# Patient Record
Sex: Female | Born: 1990
Health system: Southern US, Community
[De-identification: ages and names within clinical notes are randomized; demographics above are authoritative.]

## PROBLEM LIST (undated history)

## (undated) DIAGNOSIS — N200 Calculus of kidney: Secondary | ICD-10-CM

## (undated) DIAGNOSIS — C801 Malignant (primary) neoplasm, unspecified: Secondary | ICD-10-CM

## (undated) HISTORY — PX: TUMOR REMOVAL: SHX12

---

## 2019-05-26 DIAGNOSIS — Z20828 Contact with and (suspected) exposure to other viral communicable diseases: Secondary | ICD-10-CM | POA: Diagnosis not present

## 2019-05-26 DIAGNOSIS — Z03818 Encounter for observation for suspected exposure to other biological agents ruled out: Secondary | ICD-10-CM | POA: Diagnosis not present

## 2019-12-20 DIAGNOSIS — H6123 Impacted cerumen, bilateral: Secondary | ICD-10-CM | POA: Diagnosis not present

## 2020-09-11 ENCOUNTER — Other Ambulatory Visit: Payer: Self-pay

## 2020-09-11 ENCOUNTER — Ambulatory Visit (INDEPENDENT_AMBULATORY_CARE_PROVIDER_SITE_OTHER): Payer: BC Managed Care – PPO | Admitting: Internal Medicine

## 2020-09-11 VITALS — BP 122/71 | HR 86 | Temp 98.1°F | Ht 61.0 in | Wt 126.8 lb

## 2020-09-11 DIAGNOSIS — Z114 Encounter for screening for human immunodeficiency virus [HIV]: Secondary | ICD-10-CM | POA: Diagnosis not present

## 2020-09-11 DIAGNOSIS — Z1159 Encounter for screening for other viral diseases: Secondary | ICD-10-CM | POA: Diagnosis not present

## 2020-09-11 DIAGNOSIS — Z0000 Encounter for general adult medical examination without abnormal findings: Secondary | ICD-10-CM

## 2020-09-11 DIAGNOSIS — Z8639 Personal history of other endocrine, nutritional and metabolic disease: Secondary | ICD-10-CM

## 2020-09-11 NOTE — Progress Notes (Signed)
CC: History of Hashimoto's  HPI:  [name] is a 30 y.o. with a history of Hashimoto's disease presenting to establish care and follow-up regarding her thyroid disease. For details of today's visit and the status of his chronic medical issues please refer to the assessment and plan.   No past medical history on file.   Surgical history: Brain surgery as a child due to a neuroblastoma  Family history: Heart disease   No known allergies  Social history: Lives here in [name] alone.  Works as an Airline pilot for [employer].  Drinks alcohol socially and occasionally every few weeks.  Denies any tobacco or illicit drug use.  Not currently sexually active.  Review of Systems:   Review of Systems  Constitutional: Negative for chills, diaphoresis, fever, malaise/fatigue and weight loss.  Eyes: Negative for blurred vision.  Respiratory: Negative for cough and shortness of breath.   Cardiovascular: Negative for chest pain, palpitations and leg swelling.  Gastrointestinal: Negative for abdominal pain, constipation, diarrhea, nausea and vomiting.  Genitourinary: Negative for dysuria, frequency and urgency.  Musculoskeletal: Negative for myalgias.  Skin: Negative for itching and rash.  Neurological: Negative for dizziness, weakness and headaches.  Psychiatric/Behavioral: The patient is not nervous/anxious and does not have insomnia.      Physical Exam:  Vitals:   09/11/20 1347  BP: 122/71  Pulse: 86  Temp: 98.1 F (36.7 C)  TempSrc: Oral  SpO2: 99%  Weight: 126 lb 12.8 oz (57.5 kg)  Height: 5\' 1"  (1.549 m)   Physical Exam Vitals reviewed.  Constitutional:      General: She is not in acute distress.    Appearance: Normal appearance. She is not ill-appearing.  HENT:     Head: Normocephalic and atraumatic.  Eyes:     Extraocular Movements: Extraocular movements intact.     Pupils: Pupils are equal, round, and reactive to light.     Comments: Left pupil  more constricted than right, chronic due to brain surgery as a child  Neck:     Comments: Enlarged neck mass, greater on the left than the right Cardiovascular:     Rate and Rhythm: Normal rate and regular rhythm.     Pulses: Normal pulses.     Heart sounds: Normal heart sounds. No murmur heard. No friction rub. No gallop.   Pulmonary:     Effort: Pulmonary effort is normal. No respiratory distress.     Breath sounds: Normal breath sounds. No wheezing or rales.  Abdominal:     General: Abdomen is flat. Bowel sounds are normal. There is no distension.     Palpations: Abdomen is soft.     Tenderness: There is no abdominal tenderness. There is no guarding.  Musculoskeletal:        General: Swelling present. No tenderness.     Cervical back: Neck supple. No tenderness.     Right lower leg: No edema.     Left lower leg: No edema.  Skin:    General: Skin is warm and dry.  Neurological:     Mental Status: She is alert and oriented to person, place, and time.  Psychiatric:        Mood and Affect: Mood normal.        Behavior: Behavior normal.        Thought Content: Thought content normal.        Judgment: Judgment normal.     Assessment & Plan:   See Encounters Tab for problem based  charting.  Patient discussed with Dr. Dareen Piano

## 2020-09-11 NOTE — Assessment & Plan Note (Signed)
Follow-up HIV antibody and hep C antibody

## 2020-09-11 NOTE — Patient Instructions (Signed)
Thank you for allowing [place] to provide your care today. Today we discussed your history of hashimoto disease.  I have ordered thyroid labs for you. I will call if any are abnormal.    Should you have any questions or concerns please call the internal medicine clinic at [phone].

## 2020-09-11 NOTE — Assessment & Plan Note (Signed)
Patient reports a history of Hashimoto's thyroiditis diagnosed around 2017.  She states that she noticed a lump in her neck on the left side and had this worked up.  The time she reversed her thyroid function studies were normal but ultrasound showed multiple thyroid nodules and confirmed Hashimoto's.  She was referred to an endocrinologist but was never seen.  Denies taking any medications for her thyroid disease.  Denies any heat or cold intolerance, weight gain or weight loss, hair loss, palpitations, fatigue or anxiety.  Unfortunately unable to access her previous labs and imaging from [facility].  We will repeat thyroid function studies and thyroid antibody levels today.  If needed will obtain ultrasound imaging of the thyroid as well.  Plan: Follow-up TSH, free T3 and T4, thyroid peroxidase antibody and thyroglobulin antibody

## 2020-09-12 LAB — THYROID PEROXIDASE ANTIBODY: Thyroperoxidase Ab SerPl-aCnc: <8 IU/mL (ref 0–34)

## 2020-09-12 LAB — T4, FREE: Free T4: 0.91 ng/dL (ref 0.82–1.77)

## 2020-09-12 LAB — THYROGLOBULIN ANTIBODY: Thyroglobulin Antibody: <1 IU/mL (ref 0.0–0.9)

## 2020-09-12 LAB — HAV ANTIBODY W/ RFX: hep A Total Ab: NEGATIVE

## 2020-09-12 LAB — TSH: TSH: 0.534 u[IU]/mL (ref 0.450–4.500)

## 2020-09-12 LAB — HEPATITIS C ANTIBODY: Hep C Virus Ab: <0.1 s/co ratio (ref 0.0–0.9)

## 2020-09-12 LAB — T3, FREE: T3, Free: 4 pg/mL (ref 2.0–4.4)

## 2020-09-13 NOTE — Progress Notes (Signed)
Internal Medicine Clinic Attending ° °Case discussed with Dr. Rehman  At the time of the visit.  We reviewed the resident’s history and exam and pertinent patient test results.  I agree with the assessment, diagnosis, and plan of care documented in the resident’s note.  ° °

## 2020-09-14 ENCOUNTER — Other Ambulatory Visit: Payer: Self-pay | Admitting: Internal Medicine

## 2020-09-14 DIAGNOSIS — Z8639 Personal history of other endocrine, nutritional and metabolic disease: Secondary | ICD-10-CM

## 2020-10-06 ENCOUNTER — Ambulatory Visit (HOSPITAL_COMMUNITY): Payer: BC Managed Care – PPO

## 2020-10-10 ENCOUNTER — Ambulatory Visit (HOSPITAL_COMMUNITY)
Admission: RE | Admit: 2020-10-10 | Discharge: 2020-10-10 | Disposition: A | Discharge status: Home / Self Care | Payer: BC Managed Care – PPO | Source: Ambulatory Visit | Attending: Internal Medicine | Admitting: Internal Medicine

## 2020-10-10 ENCOUNTER — Other Ambulatory Visit: Payer: Self-pay

## 2020-10-10 DIAGNOSIS — Z8639 Personal history of other endocrine, nutritional and metabolic disease: Secondary | ICD-10-CM | POA: Diagnosis not present

## 2020-10-10 DIAGNOSIS — E041 Nontoxic single thyroid nodule: Secondary | ICD-10-CM | POA: Diagnosis not present

## 2020-11-24 NOTE — Addendum Note (Signed)
Encounter addended by: Novella Olive on: 11/24/2020 6:05 PM  Actions taken: Letter saved

## 2021-05-27 DIAGNOSIS — R112 Nausea with vomiting, unspecified: Secondary | ICD-10-CM | POA: Diagnosis not present

## 2021-05-27 DIAGNOSIS — R252 Cramp and spasm: Secondary | ICD-10-CM | POA: Diagnosis not present

## 2021-05-27 DIAGNOSIS — N200 Calculus of kidney: Secondary | ICD-10-CM | POA: Diagnosis not present

## 2021-05-27 DIAGNOSIS — R1031 Right lower quadrant pain: Secondary | ICD-10-CM | POA: Diagnosis not present

## 2021-05-27 DIAGNOSIS — N1330 Unspecified hydronephrosis: Secondary | ICD-10-CM | POA: Diagnosis not present

## 2021-05-27 DIAGNOSIS — N390 Urinary tract infection, site not specified: Secondary | ICD-10-CM | POA: Diagnosis not present

## 2021-05-27 DIAGNOSIS — N23 Unspecified renal colic: Secondary | ICD-10-CM | POA: Diagnosis not present

## 2021-05-27 DIAGNOSIS — R102 Pelvic and perineal pain: Secondary | ICD-10-CM | POA: Diagnosis not present

## 2021-06-18 IMAGING — US US THYROID
1 series · 13 of 25 positions shown · non-contrast
Comparison: None.

CLINICAL DATA: 29-year-old female with a history of Hashimoto's
thyroiditis

EXAM:
THYROID ULTRASOUND
TECHNIQUE: Ultrasound examination of the thyroid gland and adjacent soft
tissues was performed.

[Series 1: us thyroid · 13 of 87 slices shown]
[im 1/87]
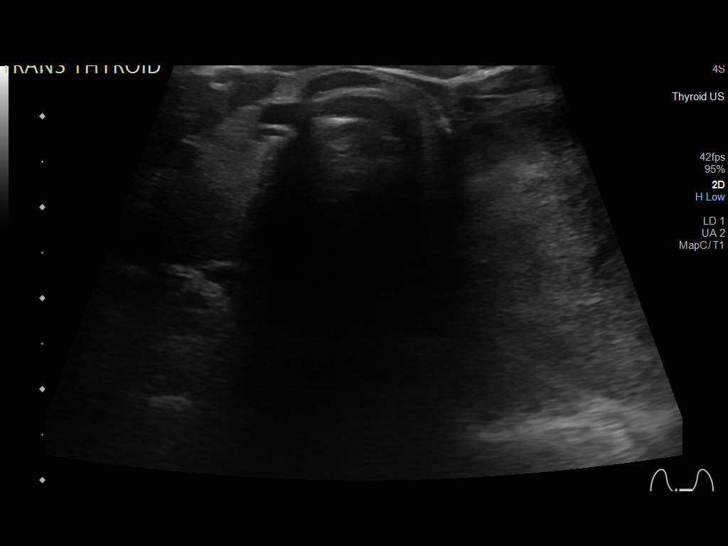
[im 8/87]
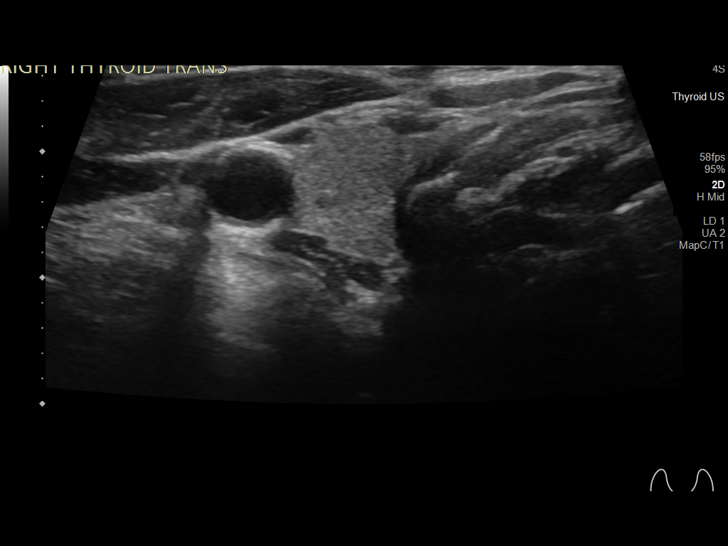
[im 15/87]
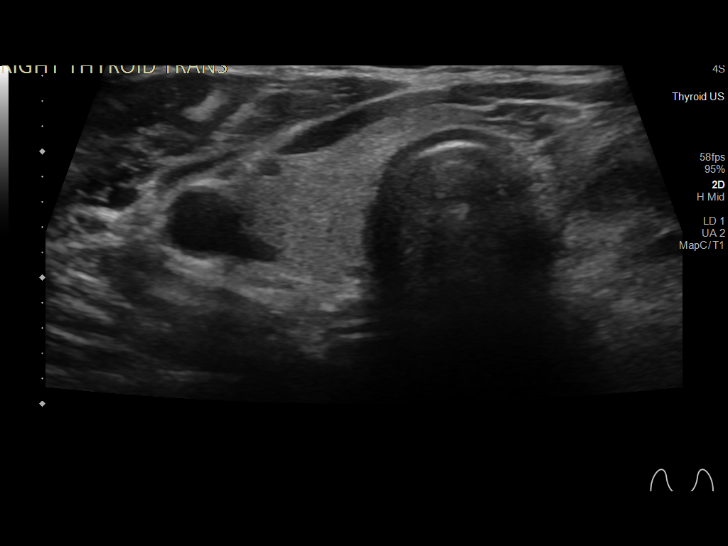
[im 22/87]
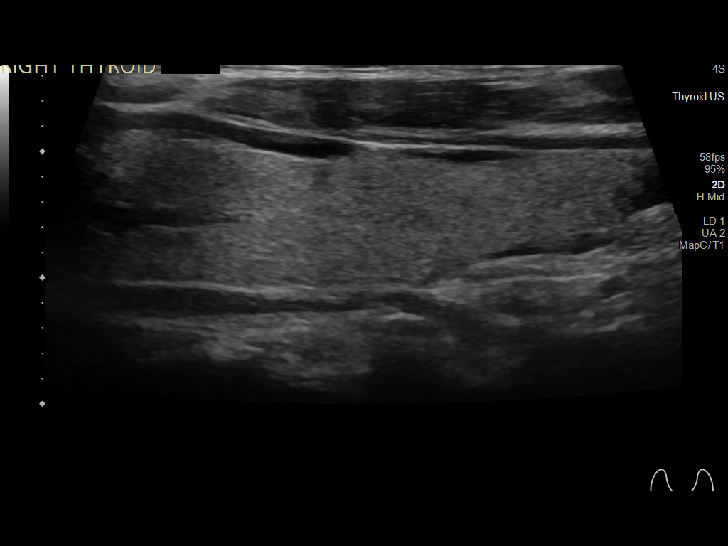
[im 29/87]
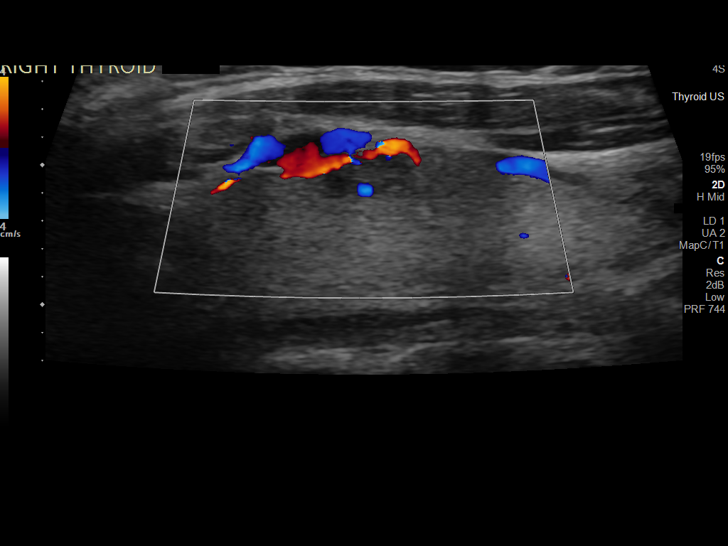
[im 36/87]
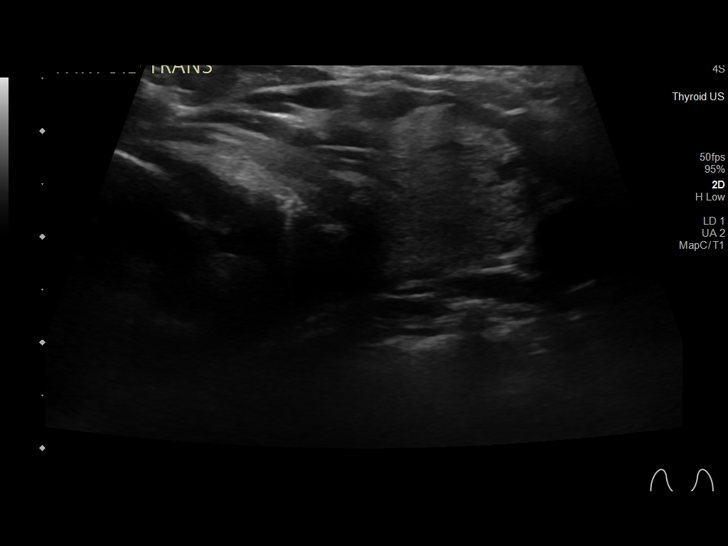
[im 44/87]
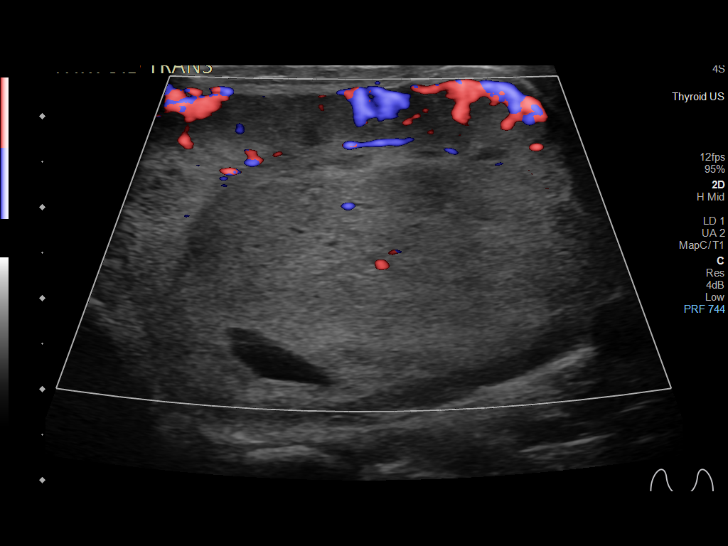
[im 51/87]
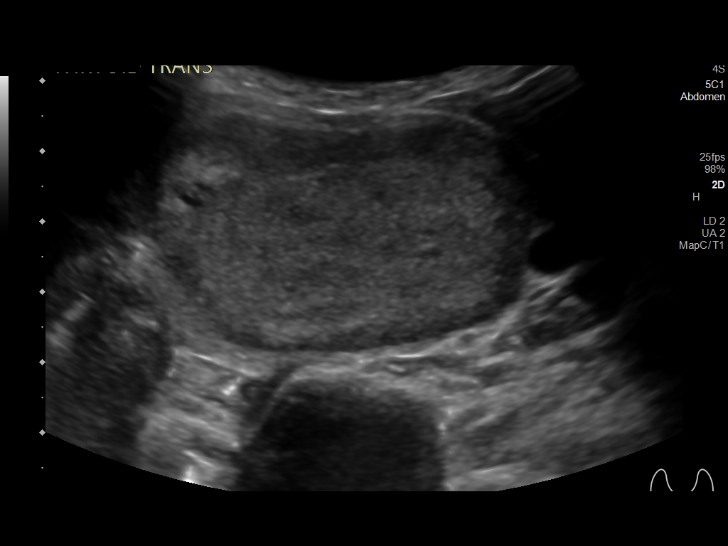
[im 58/87]
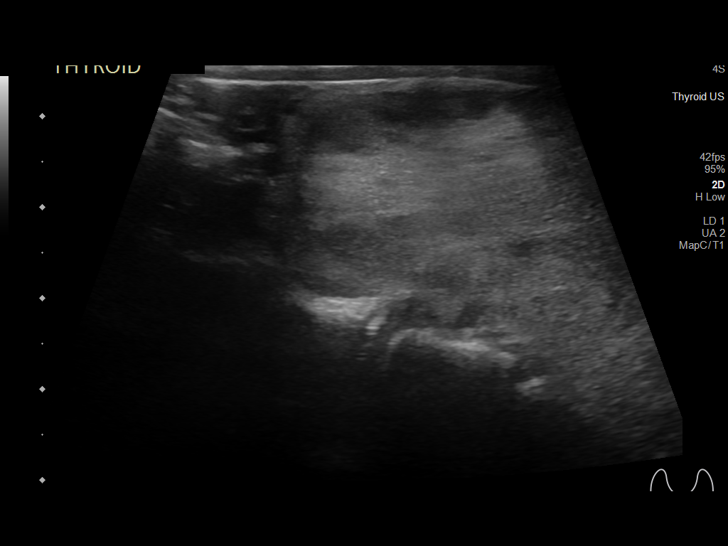
[im 65/87]
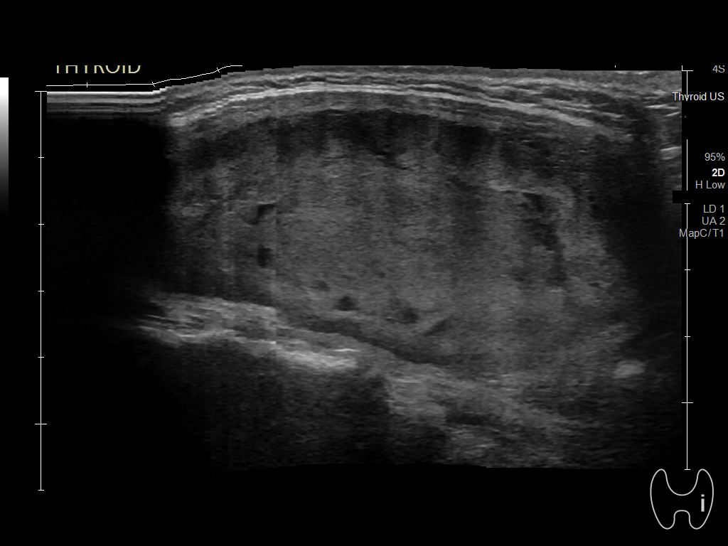
[im 72/87]
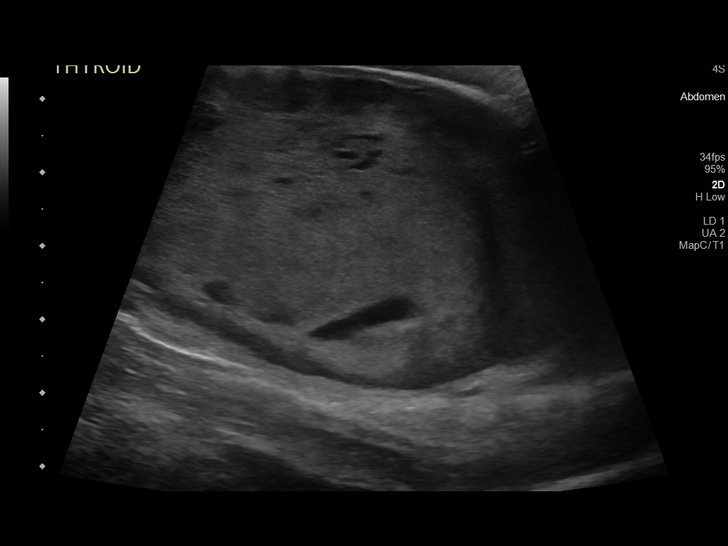
[im 79/87]
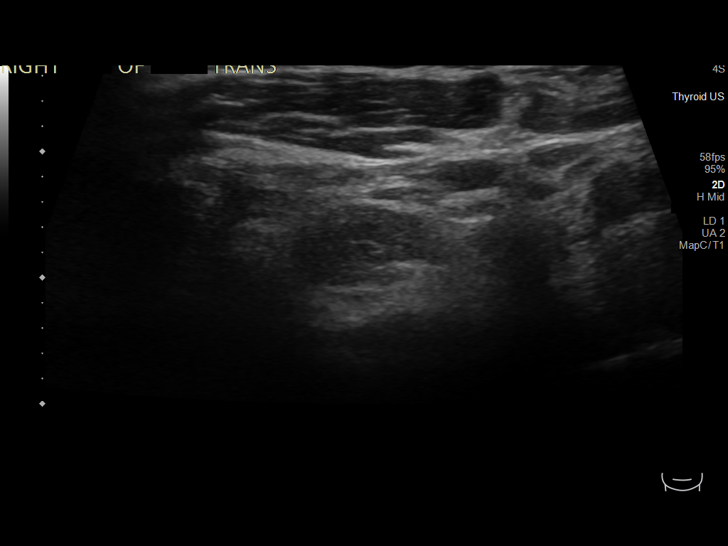
[im 87/87]
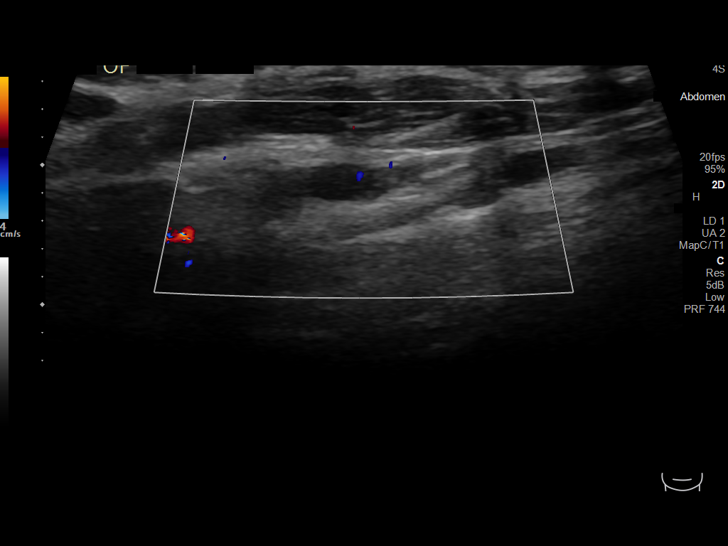

[13 of 25 positions shown; findings below may reference images not displayed]

FINDINGS: Parenchymal Echotexture: Moderately heterogenous

Isthmus: 0.3 cm

Right lobe: 4.6 cm x 1.3 cm x 1.3 cm

Left lobe: 8.9 cm x 3.9 cm x 6.1 cm

_________________________________________________________

Estimated total number of nodules >/= 1 cm: 1

Number of spongiform nodules >/=  2 cm not described below (TR1): 0

Number of mixed cystic and solid nodules >/= 1.5 cm not described
below (TR2): 0

_________________________________________________________

Nodule # 1:

Location: Right; Superior

Maximum size: 0.7 cm; Other 2 dimensions: 0.5 cm x 0.3 cm

Composition: cannot determine (2)

Echogenicity: hypoechoic (2)

Shape: not taller-than-wide (0)

Margins: ill-defined (0)

Echogenic foci: none (0)

ACR TI-RADS total points: 4.

ACR TI-RADS risk category: TR4 (4-6 points).

ACR TI-RADS recommendations:

Nodule does not meet criteria for surveillance or biopsy

_________________________________________________________

Nodule # 2:

Location: Left; Mid

Maximum size: 6.1 cm; Other 2 dimensions: 4.9 cm x 4.1 cm

Composition: solid/almost completely solid (2)

Echogenicity: isoechoic (1)

Shape: not taller-than-wide (0)

Margins: ill-defined (0)

Echogenic foci: none (0)

ACR TI-RADS total points: 3.

ACR TI-RADS risk category: TR3 (3 points).

ACR TI-RADS recommendations:

Nodule meets criteria for biopsy

_________________________________________________________

No adenopathy
IMPRESSION: Heterogeneous thyroid, potentially representing medical thyroid
disease.

Left thyroid nodule (labeled 2, 6.1 cm, TR 3) meets criteria for
biopsy, as designated by the newly established ACR TI-RADS criteria,
and referral for biopsy is recommended.

Recommendations follow those established by the new ACR TI-RADS
criteria ([HOSPITAL] 1013;[DATE]).

## 2023-07-28 ENCOUNTER — Ambulatory Visit (INDEPENDENT_AMBULATORY_CARE_PROVIDER_SITE_OTHER): Payer: BC Managed Care – PPO

## 2023-07-28 ENCOUNTER — Encounter (HOSPITAL_COMMUNITY): Payer: Self-pay

## 2023-07-28 ENCOUNTER — Ambulatory Visit (HOSPITAL_COMMUNITY)
Admission: RE | Admit: 2023-07-28 | Discharge: 2023-07-28 | Disposition: A | Discharge status: Home / Self Care | Payer: Self-pay | Source: Ambulatory Visit | Attending: Internal Medicine | Admitting: Internal Medicine

## 2023-07-28 VITALS — BP 131/84 | HR 84 | Temp 98.4°F | Resp 16

## 2023-07-28 DIAGNOSIS — T148XXA Other injury of unspecified body region, initial encounter: Secondary | ICD-10-CM

## 2023-07-28 DIAGNOSIS — Z1881 Retained glass fragments: Secondary | ICD-10-CM

## 2023-07-28 HISTORY — DX: Calculus of kidney: N20.0

## 2023-07-28 HISTORY — DX: Malignant (primary) neoplasm, unspecified: C80.1

## 2023-07-28 MED ORDER — MUPIROCIN 2 % EX OINT: 1.0000 | TOPICAL_OINTMENT | Freq: Two times a day (BID) | CUTANEOUS | 0 refills | Status: AC

## 2023-07-28 NOTE — Discharge Instructions (Addendum)
 Glass is visible on x-ray of left foot. This will be best addressed and removed by podiatry. Recommend calling them tomorrow and scheduling an appointment. For now, apply mupirocin twice daily for 3-4 days to reduce risk of infection. Use cushion in shoes and avoid walking barefoot.  Return to urgent care or PCP if symptoms worsen or fail to resolve.

## 2023-07-28 NOTE — ED Triage Notes (Signed)
 Pt reports that stepped on piece of glass in December on left foot. Reports still has a callus spot on bottom of foot that hurts esp with weight bearing. Yesterday tried soaking foot and pushed on area to see if anything would come out.

## 2023-07-28 NOTE — ED Provider Notes (Signed)
 [facility]    CSN: [id number] Arrival date & time: 07/28/23  1744      History   Chief Complaint Chief Complaint  Patient presents with   Appointment    HPI Heidi Novak is a 33 y.o. female.   33 year old female who presents urgent care with complaints of possible piece of glass in the left foot.  She reports that in December she broke a glass and stepped on it by accident.  She is felt that she has had something in her foot since then.  It is much worse when she is walking barefoot and better when she is wearing shoes.  She has tried to push it out as there is a small hole there but cannot get anything out.  She has no history of healing issues on her foot.     Past Medical History:  Diagnosis Date   Cancer [facility])    Kidney stones     Patient Active Problem List   Diagnosis Date Noted   Hx of Hashimoto thyroiditis 09/11/2020   Healthcare maintenance 09/11/2020    Past Surgical History:  Procedure Laterality Date   TUMOR REMOVAL      OB History   No obstetric history on file.      Home Medications    Prior to Admission medications   Medication Sig Start Date End Date Taking? Authorizing Provider  mupirocin ointment (BACTROBAN) 2 % Apply 1 Application topically 2 (two) times daily. 07/28/23  Yes Landis Martins, PA-C    Family History No family history on file.  Social History     Allergies   Patient has no known allergies.   Review of Systems Review of Systems  Constitutional:  Negative for chills and fever.  HENT:  Negative for ear pain and sore throat.   Eyes:  Negative for pain and visual disturbance.  Respiratory:  Negative for cough and shortness of breath.   Cardiovascular:  Negative for chest pain and palpitations.  Gastrointestinal:  Negative for abdominal pain and vomiting.  Genitourinary:  Negative for dysuria and hematuria.  Musculoskeletal:  Negative for arthralgias and back pain.  Skin:  Negative for color change  and rash.  Neurological:  Negative for seizures and syncope.  All other systems reviewed and are negative.    Physical Exam Triage Vital Signs ED Triage Vitals  Encounter Vitals Group     BP 07/28/23 1816 131/84     Systolic BP Percentile --      Diastolic BP Percentile --      Pulse Rate 07/28/23 1816 84     Resp 07/28/23 1816 16     Temp 07/28/23 1816 98.4 F (36.9 C)     Temp Source 07/28/23 1816 Oral     SpO2 07/28/23 1816 99 %     Weight --      Height --      Head Circumference --      Peak Flow --      Pain Score 07/28/23 1814 4     Pain Loc --      Pain Education --      Exclude from Growth Chart --    No data found.  Updated Vital Signs BP 131/84 (BP Location: Left Arm)   Pulse 84   Temp 98.4 F (36.9 C) (Oral)   Resp 16   LMP 07/17/2023   SpO2 99%   Visual Acuity Right Eye Distance:   Left Eye Distance:   Bilateral  Distance:    Right Eye Near:   Left Eye Near:    Bilateral Near:     Physical Exam Vitals and nursing note reviewed.  Constitutional:      General: She is not in acute distress.    Appearance: She is well-developed.  HENT:     Head: Normocephalic and atraumatic.  Eyes:     Conjunctiva/sclera: Conjunctivae normal.  Cardiovascular:     Rate and Rhythm: Normal rate and regular rhythm.     Heart sounds: No murmur heard. Pulmonary:     Effort: Pulmonary effort is normal. No respiratory distress.     Breath sounds: Normal breath sounds.  Abdominal:     Palpations: Abdomen is soft.     Tenderness: There is no abdominal tenderness.  Musculoskeletal:        General: No swelling.     Cervical back: Neck supple.       Feet:  Skin:    General: Skin is warm and dry.     Capillary Refill: Capillary refill takes less than 2 seconds.  Neurological:     Mental Status: She is alert.  Psychiatric:        Mood and Affect: Mood normal.      UC Treatments / Results  Labs (all labs ordered are listed, but only abnormal results are  displayed) Labs Reviewed - No data to display  EKG   Radiology No results found.  Procedures Procedures (including critical care time)  Medications Ordered in UC Medications - No data to display  Initial Impression / Assessment and Plan / UC Course  I have reviewed the triage vital signs and the nursing notes.  Pertinent labs & imaging results that were available during my care of the patient were reviewed by me and considered in my medical decision making (see chart for details).     Glass foreign body in skin - Plan: DG Foot Complete Left, DG Foot Complete Left   Attempted to lightly par the area but unable to identify the glass.  X-ray was obtained.  Glass is visible on x-ray of left foot. This will be best addressed and removed by podiatry. Recommend calling them tomorrow and scheduling an appointment. For now, apply mupirocin twice daily for 3-4 days to reduce risk of infection. Use cushion in shoes and avoid walking barefoot.  Return to urgent care or PCP if symptoms worsen or fail to resolve.    Final Clinical Impressions(s) / UC Diagnoses   Final diagnoses:  Glass foreign body in skin     Discharge Instructions      Glass is visible on x-ray of left foot. This will be best addressed and removed by podiatry. Recommend calling them tomorrow and scheduling an appointment. For now, apply mupirocin twice daily for 3-4 days to reduce risk of infection. Use cushion in shoes and avoid walking barefoot.  Return to urgent care or PCP if symptoms worsen or fail to resolve.     ED Prescriptions     Medication Sig Dispense Auth. Provider   mupirocin ointment (BACTROBAN) 2 % Apply 1 Application topically 2 (two) times daily. 22 g Landis Martins, [place]      PDMP not reviewed this encounter.   Quintella Reichert 07/28/23 1921
